# Patient Record
Sex: Female | Born: 1965 | Race: Black or African American | Hispanic: No | Marital: Married | Smoking: Never smoker
Health system: Southern US, Community
[De-identification: ages and names within clinical notes are randomized; demographics above are authoritative.]

## PROBLEM LIST (undated history)

## (undated) ENCOUNTER — Ambulatory Visit: Disposition: A | Discharge status: Home / Self Care | Payer: BLUE CROSS/BLUE SHIELD

## (undated) DIAGNOSIS — M1990 Unspecified osteoarthritis, unspecified site: Secondary | ICD-10-CM

## (undated) HISTORY — PX: CARPAL TUNNEL RELEASE: SHX101

## (undated) HISTORY — PX: ABDOMINAL HYSTERECTOMY: SHX81

---

## 2016-03-26 ENCOUNTER — Emergency Department (HOSPITAL_BASED_OUTPATIENT_CLINIC_OR_DEPARTMENT_OTHER)
Admission: EM | Admit: 2016-03-26 | Discharge: 2016-03-26 | Disposition: A | Discharge status: Home / Self Care | Payer: Self-pay | Attending: Emergency Medicine | Admitting: Emergency Medicine

## 2016-03-26 ENCOUNTER — Encounter (HOSPITAL_BASED_OUTPATIENT_CLINIC_OR_DEPARTMENT_OTHER): Payer: Self-pay

## 2016-03-26 ENCOUNTER — Emergency Department (HOSPITAL_BASED_OUTPATIENT_CLINIC_OR_DEPARTMENT_OTHER): Payer: Self-pay

## 2016-03-26 DIAGNOSIS — M25532 Pain in left wrist: Secondary | ICD-10-CM | POA: Insufficient documentation

## 2016-03-26 DIAGNOSIS — M79642 Pain in left hand: Secondary | ICD-10-CM

## 2016-03-26 DIAGNOSIS — Z5321 Procedure and treatment not carried out due to patient leaving prior to being seen by health care provider: Secondary | ICD-10-CM | POA: Insufficient documentation

## 2016-03-26 MED ORDER — NAPROXEN 500 MG PO TABS: 500.0000 mg | ORAL_TABLET | Freq: Two times a day (BID) | ORAL | 0 refills | Status: DC

## 2016-03-26 NOTE — ED Notes (Signed)
ED Provider at bedside. 

## 2016-03-26 NOTE — ED Triage Notes (Addendum)
C/o pain to back of left hand x "month or more"-denies pain-skin WNL-no break in skin, swelling noted-NAD-steady gait-pt LWBS earlier today-no change in hand appearance

## 2016-03-26 NOTE — ED Triage Notes (Signed)
[facility] and [facility] to [facility]-states she may also have to leave here to pick up daughter-advised to let staff know if she has to leave

## 2016-03-26 NOTE — ED Provider Notes (Signed)
[facility] Provider [facility]   CSN: [id number] Arrival date & time: 03/26/16  1954   By signing my name below, I, Soijett Blue, attest that this documentation has been prepared under the direction and in the presence of Felicie Mornavid Tam Delisle, NP Electronically Signed: Soijett Blue, ED Scribe. 03/26/16. 10:26 PM.  History   Chief Complaint Chief Complaint  Patient presents with  . Hand Pain    HPI Joan Carey is a 51 y.o. female who presents to the Emergency Department complaining of left hand pain onset 1 month ago worsening last night. Pt reports associated left hand swelling and left wrist pain. Pt hasn't tried any medications for the relief of her symptoms. 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 She denies color change, wound, and any other symptoms.    The history is provided by the patient. No language interpreter was used.  Hand Pain  This is a recurrent problem. Episode onset: 1 month ago. The problem occurs constantly. The problem has not changed since onset.The symptoms are aggravated by bending. Nothing relieves the symptoms. She has tried nothing for the symptoms. The treatment provided no relief.    History reviewed. No pertinent past medical history.  There are no active problems to display for this patient.   Past Surgical History:  Procedure Laterality Date  . ABDOMINAL HYSTERECTOMY    . CARPAL TUNNEL RELEASE      OB History    No data available       Home Medications    Prior to Admission medications   Not on File    Family History No family history on file.  Social History Social History  Substance Use Topics  . Smoking status: Never Smoker  . Smokeless tobacco: Never Used  . Alcohol use No     Allergies   Patient has no known allergies.   Review of Systems Review of Systems  Musculoskeletal: Positive for arthralgias (left hand and left wrist) and joint swelling (left hand).  Skin: Negative for color  change and wound.  All other systems reviewed and are negative.    Physical Exam Updated Vital Signs BP 144/80 (BP Location: Right Arm)   Pulse 94   Temp 98 F (36.7 C) (Oral)   Resp 20   SpO2 98%   Physical Exam  Constitutional: She is oriented to person, place, and time. She appears well-developed and well-nourished. No distress.  HENT:  Head: Normocephalic and atraumatic.  Eyes: EOM are normal.  Neck: Neck supple.  Cardiovascular: Normal rate, regular rhythm and normal heart sounds.  Exam reveals no gallop and no friction rub.   No murmur heard. Pulmonary/Chest: Effort normal and breath sounds normal. No respiratory distress. She has no wheezes. She has no rales.  Abdominal: Soft. She exhibits no distension. There is no tenderness.  Musculoskeletal: Normal range of motion.       Left wrist: She exhibits tenderness.       Left hand: She exhibits tenderness.  Left hand with tenderness across dorsal aspect of hand into wrist. Increased pain with ROM. Skin is not hot to touch.   Neurological: She is alert and oriented to person, place, and time.  Skin: Skin is warm and dry.  Psychiatric: She has a normal mood and affect. Her behavior is normal.  Nursing note and vitals reviewed.    ED Treatments / Results  DIAGNOSTIC STUDIES: Oxygen Saturation is 98% on RA, nl by my interpretation.    COORDINATION  OF CARE: 10:18 PM Discussed treatment plan with pt at bedside which includes left wrist xray and pt agreed to plan.    Radiology Dg Wrist Complete Left  Result Date: 03/26/2016 CLINICAL DATA:  51 y/o  F; 1 month of left wrist pain and swelling. EXAM: LEFT WRIST - COMPLETE 3+ VIEW COMPARISON:  None. FINDINGS: There is no evidence of fracture or dislocation. There is no evidence of arthropathy or other focal bone abnormality. IMPRESSION: No acute bony articular abnormality identified. Electronically Signed   By: Mitzi Hansen M.D.   On: 03/26/2016 22:49     Procedures Procedures (including critical care time)  Medications Ordered in ED Medications - No data to display   Initial Impression / Assessment and Plan / ED Course  I have reviewed the triage vital signs and the nursing notes.  Pertinent imaging results that were available during my care of the patient were reviewed by me and considered in my medical decision making (see chart for details).     Patient X-Ray negative for obvious fracture or dislocation.  Pt advised to follow up with orthopedics. Patient given wrist while in ED, conservative therapy recommended and discussed. Patient will be discharged home & is agreeable with above plan. Returns precautions discussed. Pt appears safe for discharge.  Final Clinical Impressions(s) / ED Diagnoses   Final diagnoses:  Left hand pain  Left wrist pain    New Prescriptions New Prescriptions   NAPROXEN (NAPROSYN) 500 MG TABLET    Take 1 tablet (500 mg total) by mouth 2 (two) times daily with a meal.   I personally performed the services described in this documentation, which was scribed in my presence. The recorded information has been reviewed and is accurate.     Felicie Morn, NP 03/27/16 0003    Charlynne Pander, MD 03/29/16 [phone]

## 2016-03-26 NOTE — ED Triage Notes (Signed)
C/o pain to back of left hand x "month or more"-denies pain-skin WNL-no break in skin, swelling noted-NAD-steady gait

## 2016-03-28 ENCOUNTER — Ambulatory Visit: Payer: Self-pay | Admitting: Family Medicine

## 2016-03-29 ENCOUNTER — Ambulatory Visit (INDEPENDENT_AMBULATORY_CARE_PROVIDER_SITE_OTHER): Payer: Self-pay | Admitting: Family Medicine

## 2016-03-29 DIAGNOSIS — M79642 Pain in left hand: Secondary | ICD-10-CM

## 2016-03-29 MED ORDER — DICLOFENAC SODIUM 75 MG PO TBEC: 75.0000 mg | DELAYED_RELEASE_TABLET | Freq: Two times a day (BID) | ORAL | 1 refills | Status: DC

## 2016-03-29 NOTE — Patient Instructions (Addendum)
You have an extensor tendinitis of your left hand. Wear wrist brace as much as possible - can try a fabric type wrap from a pharmacy that may work under your work gloves. Icing 15 minutes at a time 3-4 times a day. Continue the naproxen you have as directed - once you run out of this fill the diclofenac and take twice a day with food (if this is too expensive call me and we will try something different). Call me when cone coverage goes through and we can go ahead with therapy. Consider injection if not improving as expected. Follow up with me in 1 month otherwise but you can call me sooner if you're struggling.

## 2016-04-03 DIAGNOSIS — M79642 Pain in left hand: Secondary | ICD-10-CM | POA: Insufficient documentation

## 2016-04-03 NOTE — Progress Notes (Signed)
PCP: No primary care provider on file.  Subjective:   HPI: Patient is a 51 y.o. female here for left hand pain.  Patient denies known injury or trauma. 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 Developed pain dorsal left hand/wrist about 2 months ago that has worsened over that time. Pain worse with extension of fingers and wrist. Aching pain here. Is 3/10 level but up to 10/10 at times. Some localized swelling. No skin changes, numbness.  No past medical history on file.  Current Outpatient Prescriptions on File Prior to Visit  Medication Sig Dispense Refill  . naproxen (NAPROSYN) 500 MG tablet Take 1 tablet (500 mg total) by mouth 2 (two) times daily with a meal. 20 tablet 0   No current facility-administered medications on file prior to visit.     Past Surgical History:  Procedure Laterality Date  . ABDOMINAL HYSTERECTOMY    . CARPAL TUNNEL RELEASE      No Known Allergies  Social History   Social History  . Marital status: Married    Spouse name: N/A  . Number of children: N/A  . Years of education: N/A   Occupational History  . Not on file.   Social History Main Topics  . Smoking status: Never Smoker  . Smokeless tobacco: Never Used  . Alcohol use No  . Drug use: No  . Sexual activity: Not on file   Other Topics Concern  . Not on file   Social History Narrative  . No narrative on file    No family history on file.  BP 133/71   Pulse 77   Ht 5\' 6"  (1.676 m)   Wt 220 lb (99.8 kg)   BMI 35.51 kg/m   Review of Systems: See HPI above.     Objective:  Physical Exam:  Gen: NAD, comfortable in exam room  Left hand/wrist: No gross deformity, swelling, bruising. TTP over extensor digitorum proximal hand and wrist. No other tenderness about wrist or hand. FROM digits and wrist.  Pain on resisted wrist extension, 2nd-4th digit extension. Strength 5/5 all motions. Negative tinels, phalens, finkelsteins. NVI  distally.  Right hand/wrist: FROM without pain   Assessment & Plan:  1. Left hand pain - 2/2 extensor tendinitis.  Wrist brace as much as possible.  Icing, naproxen then switch to diclofenac when runs out.  Call when cone coverage goes through and will go ahead with occupational therapy.  F/u in 1 month.

## 2016-04-03 NOTE — Assessment & Plan Note (Signed)
2/2 extensor tendinitis.  Wrist brace as much as possible.  Icing, naproxen then switch to diclofenac when runs out.  Call when cone coverage goes through and will go ahead with occupational therapy.  F/u in 1 month.

## 2016-04-09 ENCOUNTER — Telehealth: Payer: Self-pay | Admitting: Family Medicine

## 2016-04-09 NOTE — Telephone Encounter (Signed)
Unable to contact, will call tomorrow (04-10-16).

## 2016-04-09 NOTE — Telephone Encounter (Signed)
-----   Message from Claiborne BillingsMartha J Delaney sent at 04/09/2016  4:12 PM EST ----- Regarding: Meds Question Contact: [phone] Prescribe Diclofenac on 2/6.  Went to pick it up today but it is too expensive.  Wonders if you can prescribe something else - hand is very painful

## 2016-04-09 NOTE — Telephone Encounter (Signed)
We could try meloxicam instead - 15mg  daily, 30 tablets with 1 refill if she would like to instead - this should be cheaper.

## 2016-04-16 MED ORDER — MELOXICAM 15 MG PO TABS: 15.0000 mg | ORAL_TABLET | Freq: Every day | ORAL | 2 refills | Status: DC

## 2016-04-16 NOTE — Telephone Encounter (Signed)
Ok sent it in.  Thanks!

## 2016-04-16 NOTE — Telephone Encounter (Signed)
Spoke to patient and she would like to try the Meloxicam. [facility] [address] Thanks.

## 2018-05-03 ENCOUNTER — Other Ambulatory Visit: Payer: Self-pay

## 2018-05-03 ENCOUNTER — Emergency Department (HOSPITAL_BASED_OUTPATIENT_CLINIC_OR_DEPARTMENT_OTHER)
Admission: EM | Admit: 2018-05-03 | Discharge: 2018-05-03 | Disposition: A | Discharge status: Home / Self Care | Payer: BLUE CROSS/BLUE SHIELD | Attending: Emergency Medicine | Admitting: Emergency Medicine

## 2018-05-03 ENCOUNTER — Encounter (HOSPITAL_BASED_OUTPATIENT_CLINIC_OR_DEPARTMENT_OTHER): Payer: Self-pay | Admitting: *Deleted

## 2018-05-03 DIAGNOSIS — M62838 Other muscle spasm: Secondary | ICD-10-CM

## 2018-05-03 DIAGNOSIS — M62831 Muscle spasm of calf: Secondary | ICD-10-CM | POA: Insufficient documentation

## 2018-05-03 DIAGNOSIS — R252 Cramp and spasm: Secondary | ICD-10-CM | POA: Insufficient documentation

## 2018-05-03 DIAGNOSIS — M79606 Pain in leg, unspecified: Secondary | ICD-10-CM | POA: Diagnosis present

## 2018-05-03 LAB — COMPREHENSIVE METABOLIC PANEL
ALT: 54 U/L — AB (ref 0–44)
AST: 41 U/L (ref 15–41)
Albumin: 3.9 g/dL (ref 3.5–5.0)
Alkaline Phosphatase: 59 U/L (ref 38–126)
Anion gap: 5 (ref 5–15)
BUN: 16 mg/dL (ref 6–20)
CALCIUM: 9.2 mg/dL (ref 8.9–10.3)
CHLORIDE: 105 mmol/L (ref 98–111)
CO2: 26 mmol/L (ref 22–32)
CREATININE: 0.83 mg/dL (ref 0.44–1.00)
Glucose, Bld: 100 mg/dL — ABNORMAL HIGH (ref 70–99)
Potassium: 4.5 mmol/L (ref 3.5–5.1)
SODIUM: 136 mmol/L (ref 135–145)
Total Bilirubin: 0.9 mg/dL (ref 0.3–1.2)
Total Protein: 6.9 g/dL (ref 6.5–8.1)

## 2018-05-03 LAB — CBC
HCT: 41.7 % (ref 36.0–46.0)
HEMOGLOBIN: 12.7 g/dL (ref 12.0–15.0)
MCH: 25.2 pg — AB (ref 26.0–34.0)
MCHC: 30.5 g/dL (ref 30.0–36.0)
MCV: 82.7 fL (ref 80.0–100.0)
Platelets: 227 10*3/uL (ref 150–400)
RBC: 5.04 MIL/uL (ref 3.87–5.11)
RDW: 16.1 % — ABNORMAL HIGH (ref 11.5–15.5)
WBC: 3.9 10*3/uL — AB (ref 4.0–10.5)
nRBC: 0 % (ref 0.0–0.2)

## 2018-05-03 MED ORDER — METHOCARBAMOL 750 MG PO TABS: 750.0000 mg | ORAL_TABLET | Freq: Three times a day (TID) | ORAL | 0 refills | Status: AC | PRN

## 2018-05-03 NOTE — ED Triage Notes (Signed)
Patient stated that she has been having "Charlie horse" to both legs for a few days.  Went to her PCP and was sent to an orthopedist and got a cortisone shot with minimal relief.  It seems that it is getting worst.

## 2018-05-03 NOTE — ED Provider Notes (Addendum)
[facility] EMERGENCY DEPARTMENT Provider Note   CSN: [id number] Arrival date & time: 05/03/18  0930    History   Chief Complaint Chief Complaint  Patient presents with  . Leg Pain    HPI Joan Carey is a 53 y.o. female.     Patient c/o intermittent muscle cramping for the past few days. States will get spasms of cramping/pain, especially in bil legs, and also in left forearm/wrist. Symptoms last seconds up to 5 minutes per episode. Symptoms acute onset, moderate, intermittent. Symptoms occur at rest, no relation to activity or exertion. No swelling to extremities. No numbness/weakness. No neck or back pain. No radicular pain. No fever or chills. No headache. Denies recent change in meds. No nvd. No skin changes, rash, lesions or swelling.   The history is provided by the patient.  Leg Pain  Associated symptoms: no back pain, no fever and no neck pain     History reviewed. No pertinent past medical history.  Patient Active Problem List   Diagnosis Date Noted  . Left hand pain 04/03/2016    Past Surgical History:  Procedure Laterality Date  . ABDOMINAL HYSTERECTOMY    . CARPAL TUNNEL RELEASE       OB History    Gravida  3   Para      Term      Preterm      AB      Living        SAB      TAB      Ectopic      Multiple      Live Births               Home Medications    Prior to Admission medications   Medication Sig Start Date End Date Taking? Authorizing Provider  meloxicam (MOBIC) 15 MG tablet Take 1 tablet (15 mg total) by mouth daily. 04/16/16   Lenda Kelp, MD    Family History History reviewed. No pertinent family history.  Social History Social History   Tobacco Use  . Smoking status: Never Smoker  . Smokeless tobacco: Never Used  Substance Use Topics  . Alcohol use: No  . Drug use: No     Allergies   Patient has no known allergies.   Review of Systems Review of Systems  Constitutional: Negative for  fever.  HENT: Negative for sore throat.   Eyes: Negative for redness.  Respiratory: Negative for cough and shortness of breath.   Cardiovascular: Negative for chest pain.  Gastrointestinal: Negative for abdominal pain, diarrhea and vomiting.  Endocrine: Negative for polyuria.  Genitourinary: Negative for dysuria and flank pain.  Musculoskeletal: Negative for back pain and neck pain.  Skin: Negative for rash.  Neurological: Negative for weakness, numbness and headaches.  Hematological: Does not bruise/bleed easily.  Psychiatric/Behavioral: Negative for confusion.     Physical Exam Updated Vital Signs BP (!) 146/80 (BP Location: Right Arm)   Pulse 81   Resp 16   Ht 1.651 m (5\' 5" )   Wt 100.3 kg   SpO2 100%   BMI 36.80 kg/m   Physical Exam Vitals signs and nursing note reviewed.  Constitutional:      Appearance: Normal appearance. She is well-developed.  HENT:     Head: Atraumatic.     Nose: Nose normal.     Mouth/Throat:     Mouth: Mucous membranes are moist.  Eyes:     General: No scleral icterus.  Conjunctiva/sclera: Conjunctivae normal.     Pupils: Pupils are equal, round, and reactive to light.  Neck:     Musculoskeletal: Normal range of motion and neck supple. No neck rigidity or muscular tenderness.     Trachea: No tracheal deviation.     Comments: No tm or tenderness.  Cardiovascular:     Rate and Rhythm: Normal rate and regular rhythm.     Pulses: Normal pulses.     Heart sounds: Normal heart sounds. No murmur. No friction rub. No gallop.   Pulmonary:     Effort: Pulmonary effort is normal. No respiratory distress.     Breath sounds: Normal breath sounds.  Abdominal:     General: Bowel sounds are normal. There is no distension.     Palpations: Abdomen is soft.     Tenderness: There is no abdominal tenderness. There is no guarding.  Genitourinary:    Comments: No cva tenderness.  Musculoskeletal:        General: No swelling.     Right lower leg: No  edema.     Left lower leg: No edema.     Comments: bil radial and dp/pt pulses 2+. No extremity swelling. No pain w rom. No focal pain or tenderness on exam.   Skin:    General: Skin is warm and dry.     Findings: No rash.  Neurological:     Mental Status: She is alert.     Comments: Alert, speech normal. Motor/sens grossly intact bil. Steady gait.   Psychiatric:        Mood and Affect: Mood normal.      ED Treatments / Results  Labs (all labs ordered are listed, but only abnormal results are displayed) Results for orders placed or performed during the hospital encounter of 05/03/18  CBC  Result Value Ref Range   WBC 3.9 (L) 4.0 - 10.5 K/uL   RBC 5.04 3.87 - 5.11 MIL/uL   Hemoglobin 12.7 12.0 - 15.0 g/dL   HCT 33.4 35.6 - 86.1 %   MCV 82.7 80.0 - 100.0 fL   MCH 25.2 (L) 26.0 - 34.0 pg   MCHC 30.5 30.0 - 36.0 g/dL   RDW 68.3 (H) 72.9 - 02.1 %   Platelets 227 150 - 400 K/uL   nRBC 0.0 0.0 - 0.2 %  Comprehensive metabolic panel  Result Value Ref Range   Sodium 136 135 - 145 mmol/L   Potassium 4.5 3.5 - 5.1 mmol/L   Chloride 105 98 - 111 mmol/L   CO2 26 22 - 32 mmol/L   Glucose, Bld 100 (H) 70 - 99 mg/dL   BUN 16 6 - 20 mg/dL   Creatinine, Ser 1.15 0.44 - 1.00 mg/dL   Calcium 9.2 8.9 - 52.0 mg/dL   Total Protein 6.9 6.5 - 8.1 g/dL   Albumin 3.9 3.5 - 5.0 g/dL   AST 41 15 - 41 U/L   ALT 54 (H) 0 - 44 U/L   Alkaline Phosphatase 59 38 - 126 U/L   Total Bilirubin 0.9 0.3 - 1.2 mg/dL   GFR calc non Af Amer >60 >60 mL/min   GFR calc Af Amer >60 >60 mL/min   Anion gap 5 5 - 15    EKG None  Radiology No results found.  Procedures Procedures (including critical care time)  Medications Ordered in ED Medications - No data to display   Initial Impression / Assessment and Plan / ED Course  I have reviewed the triage vital  signs and the nursing notes.  Pertinent labs & imaging results that were available during my care of the patient were reviewed by me and  considered in my medical decision making (see chart for details).  Labs sent.  Pt indicates was looking up her symptoms on google, and wants her potassium level checked.   Reviewed nursing notes and prior charts for additional history.   Labs reviewed - k normal.  No current muscle spasm, swelling or other abnormality on exam.   Discussed labs w pt.   rec outpatient pcp f/u.     Final Clinical Impressions(s) / ED Diagnoses   Final diagnoses:  None    ED Discharge Orders    None           Cathren Laine, MD 05/03/18 1055

## 2018-05-03 NOTE — Discharge Instructions (Signed)
It was our pleasure to provide your ER care today - we hope that you feel better.  From today's labs, your potassium looks good/normal.   Drink plenty of fluids, stay well hydrated.   You may take acetaminophen or ibuprofen as need if pain. You may try robaxin as need for muscle spasm/pain.  Follow up with primary care doctor in the next 1-2 weeks.

## 2019-08-10 ENCOUNTER — Other Ambulatory Visit: Payer: Self-pay | Admitting: Orthopedic Surgery

## 2019-08-10 ENCOUNTER — Other Ambulatory Visit: Payer: Self-pay

## 2019-08-10 ENCOUNTER — Emergency Department (HOSPITAL_BASED_OUTPATIENT_CLINIC_OR_DEPARTMENT_OTHER)
Admission: EM | Admit: 2019-08-10 | Discharge: 2019-08-10 | Disposition: A | Discharge status: Home / Self Care | Payer: Managed Care, Other (non HMO) | Attending: Emergency Medicine | Admitting: Emergency Medicine

## 2019-08-10 ENCOUNTER — Encounter (HOSPITAL_BASED_OUTPATIENT_CLINIC_OR_DEPARTMENT_OTHER): Payer: Self-pay | Admitting: *Deleted

## 2019-08-10 DIAGNOSIS — M25561 Pain in right knee: Secondary | ICD-10-CM | POA: Diagnosis not present

## 2019-08-10 DIAGNOSIS — G8929 Other chronic pain: Secondary | ICD-10-CM | POA: Diagnosis not present

## 2019-08-10 DIAGNOSIS — M79604 Pain in right leg: Secondary | ICD-10-CM

## 2019-08-10 HISTORY — DX: Unspecified osteoarthritis, unspecified site: M19.90

## 2019-08-10 MED ORDER — CYCLOBENZAPRINE HCL 10 MG PO TABS: 10.0000 mg | ORAL_TABLET | Freq: Three times a day (TID) | ORAL | 0 refills | Status: AC | PRN

## 2019-08-10 NOTE — ED Triage Notes (Signed)
Bilateral knee pain.  Knee is swollen and is getting worst in the past 2 weeks.

## 2019-08-10 NOTE — Discharge Instructions (Signed)
Recommend follow-up with your primary orthopedic doctor about your knee pain.  Can take the newly prescribed Flexeril as needed for pain, muscle spasms.  Note this can make you drowsy should not be taken with driving or operating heavy machinery.  If you develop worsening swelling, fever, redness, inability to walk, recommend return to ER for reassessment.

## 2019-08-11 NOTE — ED Provider Notes (Signed)
[facility] EMERGENCY DEPARTMENT Provider Note   CSN: [id number] Arrival date & time: 08/10/19  4081     History Chief Complaint  Patient presents with   Knee Pain    Joan Carey is a 54 y.o. female.  Presents to ER with concern for knee pain.  Predominantly having pain in the right knee.  Reports long history of bilateral knee pain, concerned that she may have some knee swelling and worsening of her right knee pain.  Has been ambulatory.  Has tried over-the-counter medicines with minimal relief.  Has been evaluated previously by orthopedic surgery.  Reports that she has had prior x-rays with her other doctors.  No new falls, no fevers.  No redness.  HPI     Past Medical History:  Diagnosis Date   Arthritis     Patient Active Problem List   Diagnosis Date Noted   Left hand pain 04/03/2016    Past Surgical History:  Procedure Laterality Date   ABDOMINAL HYSTERECTOMY     CARPAL TUNNEL RELEASE       OB History    Gravida  3   Para  3   Term      Preterm      AB      Living        SAB      TAB      Ectopic      Multiple      Live Births              History reviewed. No pertinent family history.  Social History   Tobacco Use   Smoking status: Never Smoker   Smokeless tobacco: Never Used  Substance Use Topics   Alcohol use: Yes    Comment: occasionally   Drug use: No    Home Medications Prior to Admission medications   Medication Sig Start Date End Date Taking? Authorizing Provider  cyclobenzaprine (FLEXERIL) 10 MG tablet Take 1 tablet (10 mg total) by mouth 3 (three) times daily as needed for muscle spasms. 08/10/19   Milagros Loll, MD  methocarbamol (ROBAXIN) 750 MG tablet Take 1 tablet (750 mg total) by mouth 3 (three) times daily as needed (muscle spasm/pain). 05/03/18   Cathren Laine, MD    Allergies    Patient has no known allergies.  Review of Systems   Review of Systems  Constitutional: Negative for  chills and fever.  HENT: Negative for ear pain and sore throat.   Eyes: Negative for pain and visual disturbance.  Respiratory: Negative for cough and shortness of breath.   Cardiovascular: Negative for chest pain and palpitations.  Gastrointestinal: Negative for abdominal pain and vomiting.  Genitourinary: Negative for dysuria and hematuria.  Musculoskeletal: Positive for arthralgias. Negative for back pain.  Skin: Negative for color change and rash.  Neurological: Negative for seizures and syncope.  All other systems reviewed and are negative.   Physical Exam Updated Vital Signs BP (!) 148/68 (BP Location: Left Arm)    Pulse 88    Temp 98.4 F (36.9 C) (Oral)    Resp 16    Ht 5\' 5"  (1.651 m)    Wt 102.1 kg    SpO2 100%    BMI 37.44 kg/m   Physical Exam Vitals and nursing note reviewed.  Constitutional:      General: She is not in acute distress.    Appearance: She is well-developed.  HENT:     Head: Normocephalic and atraumatic.  Eyes:  Conjunctiva/sclera: Conjunctivae normal.  Cardiovascular:     Rate and Rhythm: Normal rate and regular rhythm.     Pulses: Normal pulses.  Pulmonary:     Effort: Pulmonary effort is normal. No respiratory distress.  Musculoskeletal:     Cervical back: Neck supple.     Comments: Right lower extremity: There is some tenderness over the right knee, no obvious deformities appreciated, no swelling, normal ROM, normal DP/PT pulses  Left lower extremity: No tenderness throughout extremity, no deformity, normal ROM, normal DP/PT pulses  Skin:    General: Skin is warm and dry.  Neurological:     Mental Status: She is alert.     ED Results / Procedures / Treatments   Labs (all labs ordered are listed, but only abnormal results are displayed) Labs Reviewed - No data to display  EKG None  Radiology No results found.  Procedures Procedures (including critical care time)  Medications Ordered in ED Medications - No data to  display  ED Course  I have reviewed the triage vital signs and the nursing notes.  Pertinent labs & imaging results that were available during my care of the patient were reviewed by me and considered in my medical decision making (see chart for details).    MDM Rules/Calculators/A&P                          54 year old lady who presented to ER with concern for acute on chronic right knee pain.  On exam there is no deformity or significant swelling appreciated, normal joint range of motion.  Suspect this is related to her chronic right knee pain.  She reports that she is established with an orthopedic provider previously.  Given there is no associated trauma, there is no deformity on exam, do not feel emergent imaging needed today.  Will give Rx for muscle relaxer, recommend close follow-up with her orthopedic doctor who has managed this issue previously.    After the discussed management above, the patient was determined to be safe for discharge.  The patient was in agreement with this plan and all questions regarding their care were answered.  ED return precautions were discussed and the patient will return to the ED with any significant worsening of condition.   Final Clinical Impression(s) / ED Diagnoses Final diagnoses:  Chronic pain of right knee    Rx / DC Orders ED Discharge Orders         Ordered    cyclobenzaprine (FLEXERIL) 10 MG tablet  3 times daily PRN     Discontinue  Reprint     08/10/19 0909           Lucrezia Starch, MD 08/11/19 [phone]

## 2019-08-20 ENCOUNTER — Ambulatory Visit
Admission: RE | Admit: 2019-08-20 | Discharge: 2019-08-20 | Disposition: A | Discharge status: Home / Self Care | Payer: PRIVATE HEALTH INSURANCE | Source: Ambulatory Visit | Attending: Orthopedic Surgery | Admitting: Orthopedic Surgery

## 2019-08-20 ENCOUNTER — Other Ambulatory Visit: Payer: Self-pay

## 2019-08-20 DIAGNOSIS — M79604 Pain in right leg: Secondary | ICD-10-CM

## 2020-08-20 IMAGING — CT CT BIOPSY
4 of 7 series · 12 of 32 positions shown, 17 images · non-contrast
Comparison: none

CLINICAL DATA: Right sacroiliac and leg pain.

[Series 3: needle -guided injection · axial · 0.90mm/px · z∈[+745,+787]mm · 4 of 36 slices shown, 9 images (1 of 4)]
[im 8/36  soft-tissue]
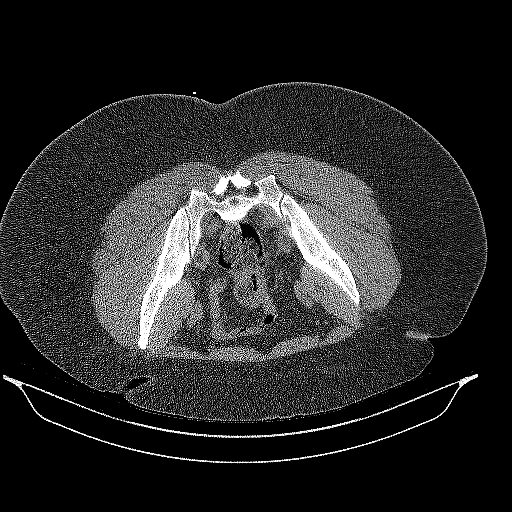
[im 8/36  lung]
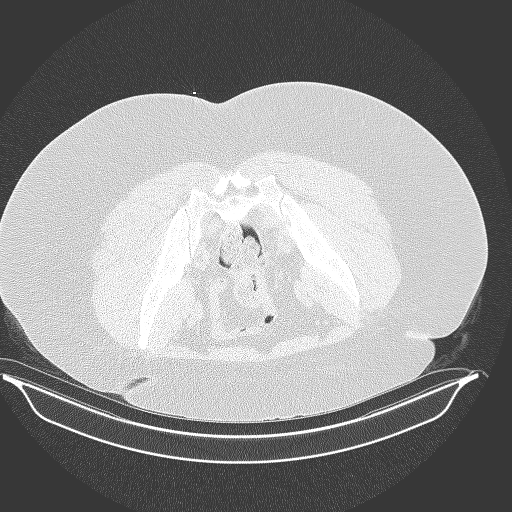
[im 8/36  bone]
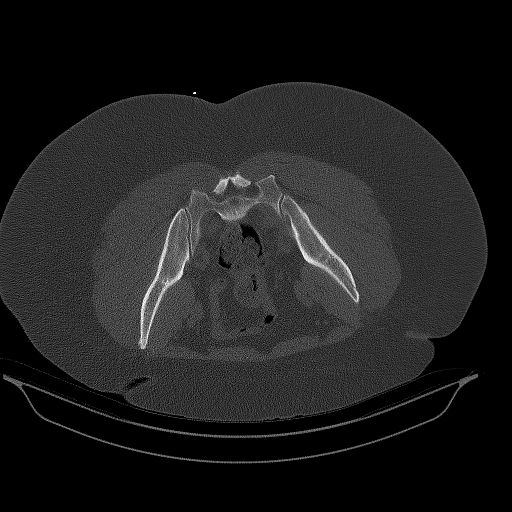
[im 15/36  soft-tissue]
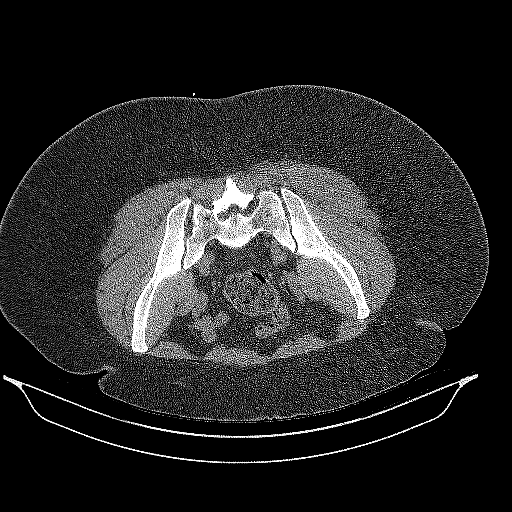
[im 15/36  lung]
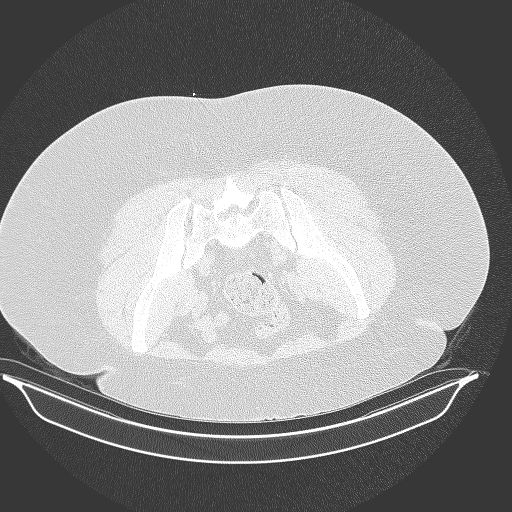
[im 22/36  soft-tissue]
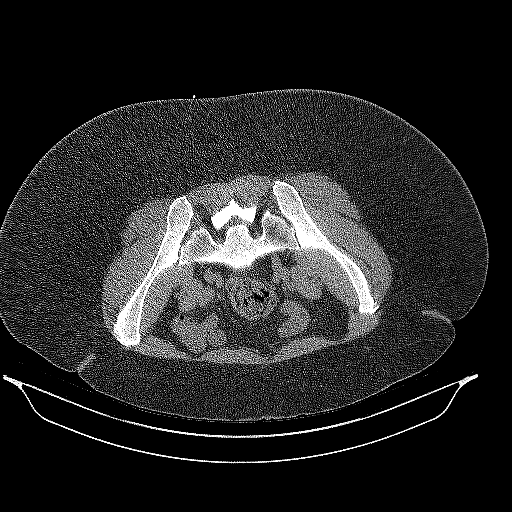
[im 22/36  lung]
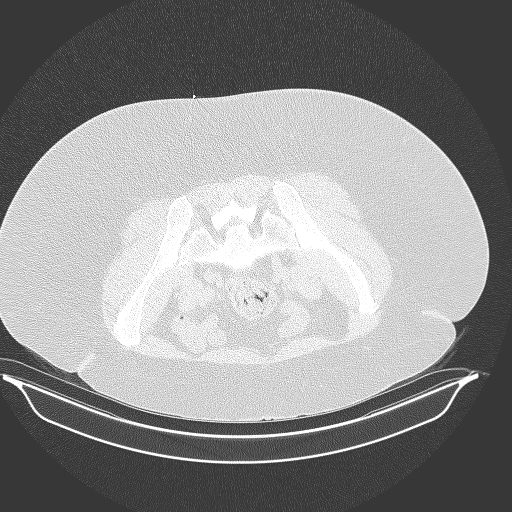
[im 29/36  soft-tissue]
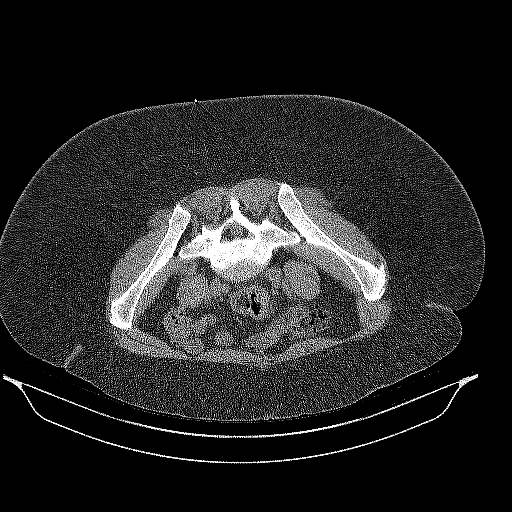
[im 29/36  lung]
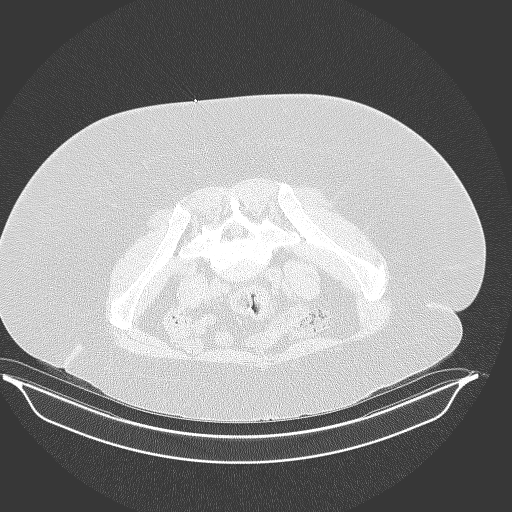

[Series 4: needle -guided injection · axial · 0.90mm/px · z∈[+743,+757]mm · 2 of 21 slices shown (2 of 4)]
[im 7/21  soft-tissue]
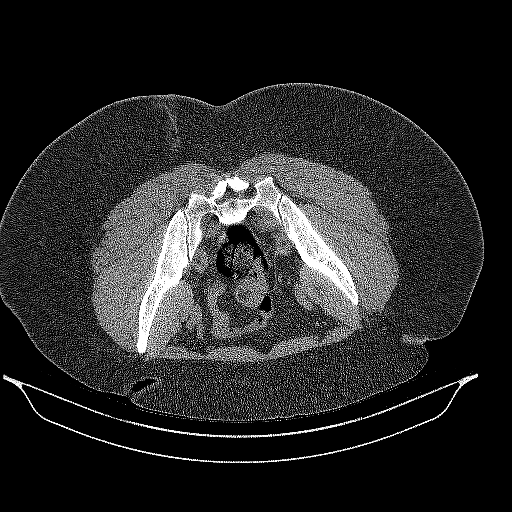
[im 14/21  soft-tissue]
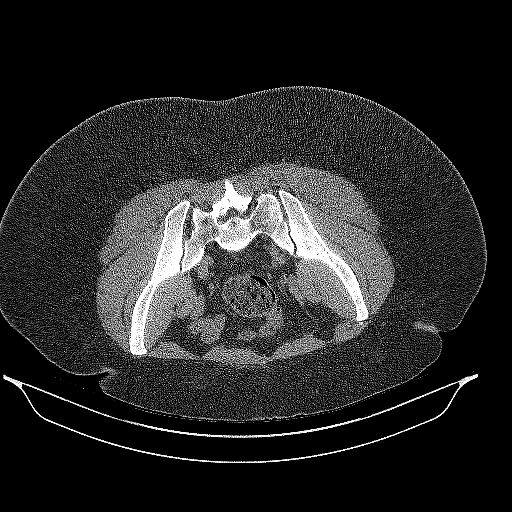

[Series 5: needle -guided injection · axial · 0.90mm/px · z∈[+743,+757]mm · 2 of 21 slices shown (3 of 4)]
[im 7/21  soft-tissue]
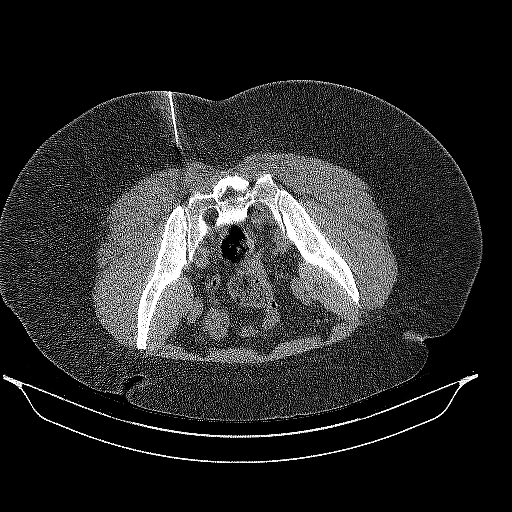
[im 14/21  soft-tissue]
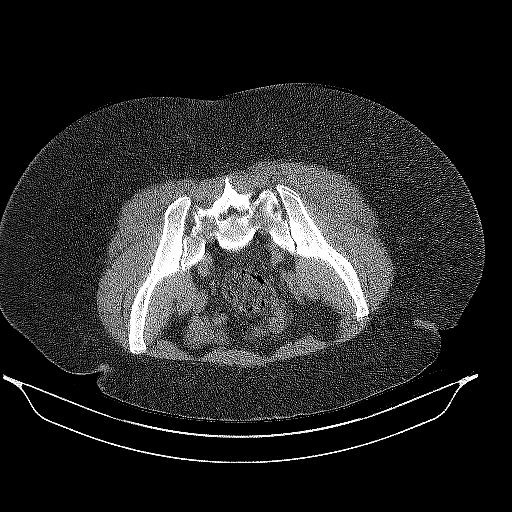

[Series 9: needle -guided injection · axial · 0.90mm/px · z∈[+746,+782]mm · 4 of 31 slices shown (4 of 4)]
[im 7/31  soft-tissue]
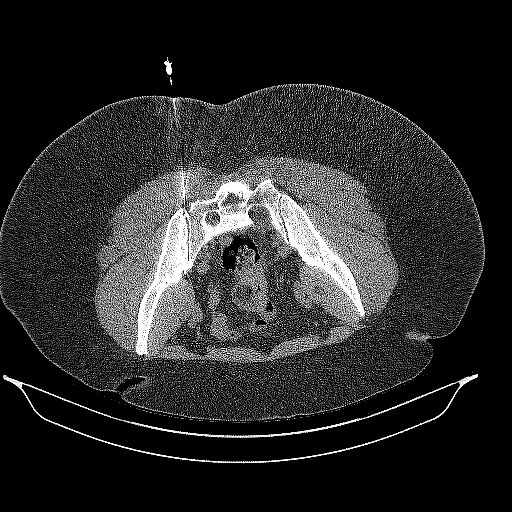
[im 13/31  soft-tissue]
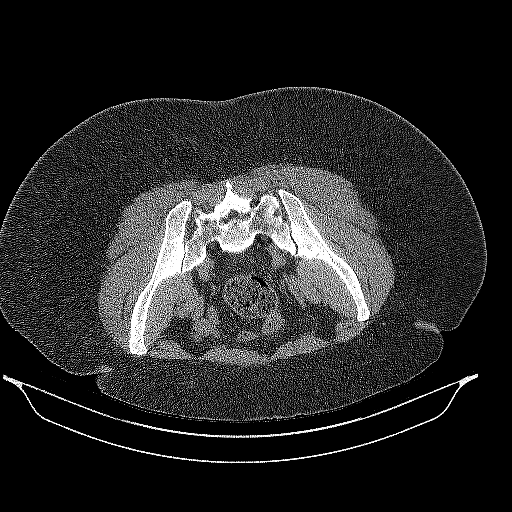
[im 19/31  soft-tissue]
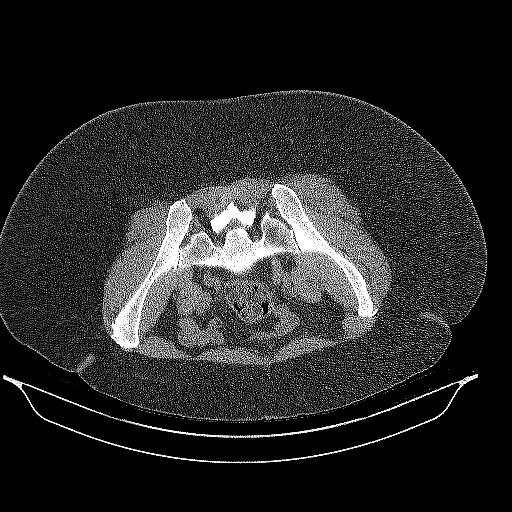
[im 25/31  soft-tissue]
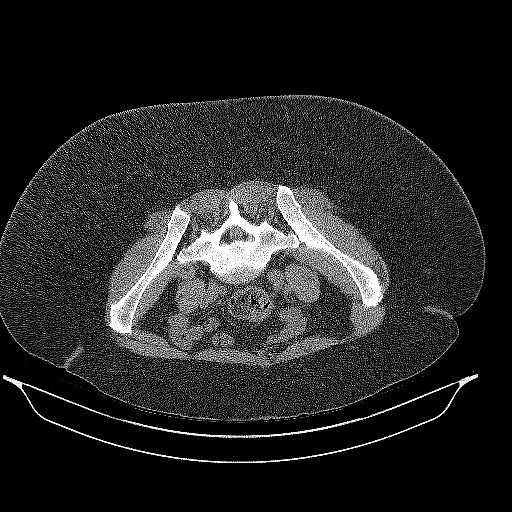

[12 of 32 positions shown; findings below may reference images not displayed]

EXAM:
CT GUIDED RIGHT SI JOINT INJECTION

PROCEDURE:
After a thorough discussion of risks and benefits of the procedure,
including bleeding, infection, injury to nerves, blood vessels, and
adjacent structures, verbal and written consent was obtained. The
patient was placed prone on the CT table and localization was
performed over the sacrum. Target site marked using CT guidance. The
skin was prepped and draped in the usual sterile fashion using
Betadine soap.

After local anesthesia with 1% lidocaine without epinephrine and
subsequent deep anesthesia, a 5 inch 22 gauge spinal needle was
advanced into the right SI joint under intermittent CT guidance.

Injection of a small amount of Isovue-M 200 demonstrated contrast
spread throughout the joint. Subsequently, 2 mL of 0.5% bupivacaine
were injected into the right SI joint. The needle was removed and a
sterile dressing applied.

No complications were observed.
IMPRESSION: Successful CT-guided right SI joint injection.
